# Patient Record
Sex: Female | Born: 1945 | Race: White | Hispanic: No | Marital: Married
Health system: Southern US, Community
[De-identification: ages and names within clinical notes are randomized; demographics above are authoritative.]

---

## 2000-01-28 ENCOUNTER — Ambulatory Visit (HOSPITAL_COMMUNITY): Admission: RE | Admit: 2000-01-28 | Discharge: 2000-01-28 | Payer: Self-pay | Admitting: Gastroenterology

## 2000-11-28 ENCOUNTER — Other Ambulatory Visit: Admission: RE | Admit: 2000-11-28 | Discharge: 2000-11-28 | Payer: Self-pay | Admitting: Gynecology

## 2002-05-11 ENCOUNTER — Other Ambulatory Visit: Admission: RE | Admit: 2002-05-11 | Discharge: 2002-05-11 | Payer: Self-pay | Admitting: Gynecology

## 2002-05-13 ENCOUNTER — Encounter: Admission: RE | Admit: 2002-05-13 | Discharge: 2002-05-13 | Payer: Self-pay | Admitting: Gynecology

## 2002-05-13 ENCOUNTER — Encounter: Payer: Self-pay | Admitting: Gynecology

## 2003-12-22 ENCOUNTER — Other Ambulatory Visit: Admission: RE | Admit: 2003-12-22 | Discharge: 2003-12-22 | Payer: Self-pay | Admitting: Gynecology

## 2005-04-09 ENCOUNTER — Encounter: Admission: RE | Admit: 2005-04-09 | Discharge: 2005-04-09 | Payer: Self-pay | Admitting: Internal Medicine

## 2005-04-25 ENCOUNTER — Encounter: Admission: RE | Admit: 2005-04-25 | Discharge: 2005-04-25 | Payer: Self-pay | Admitting: Internal Medicine

## 2005-11-07 ENCOUNTER — Encounter: Admission: RE | Admit: 2005-11-07 | Discharge: 2005-11-07 | Payer: Self-pay | Admitting: Internal Medicine

## 2006-05-13 ENCOUNTER — Encounter: Admission: RE | Admit: 2006-05-13 | Discharge: 2006-05-13 | Payer: Self-pay | Admitting: Internal Medicine

## 2007-04-02 ENCOUNTER — Other Ambulatory Visit: Admission: RE | Admit: 2007-04-02 | Discharge: 2007-04-02 | Payer: Self-pay | Admitting: *Deleted

## 2007-04-16 ENCOUNTER — Encounter: Admission: RE | Admit: 2007-04-16 | Discharge: 2007-04-16 | Payer: Self-pay | Admitting: *Deleted

## 2008-12-27 IMAGING — CT CT CHEST W/O CM
2 of 4 series · 15 of 36 positions shown, 18 images · IV contrast (agent unspecified)
Comparison: 05/13/06.

CLINICAL DATA: Follow-up lung nodules. 
 CHEST CT WITHOUT CONTRAST:
TECHNIQUE: Multidetector CT imaging of the chest was performed following the standard protocol without IV contrast.

[Series 3: routine chest · axial · 0.70mm/px · z∈[-258,+7]mm · 12 of 63 slices shown, 15 images]
[im 5/63  mediastinal]
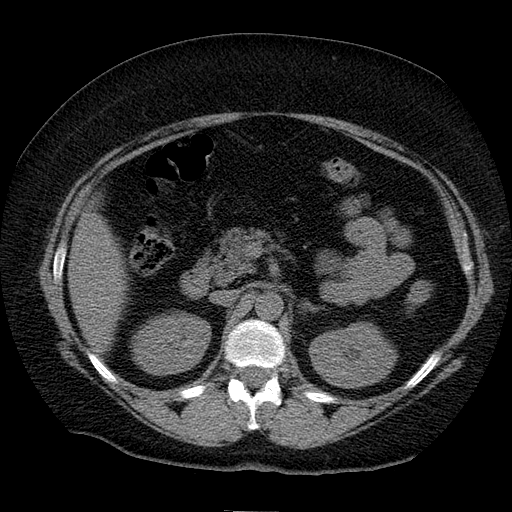
[im 5/63  lung]
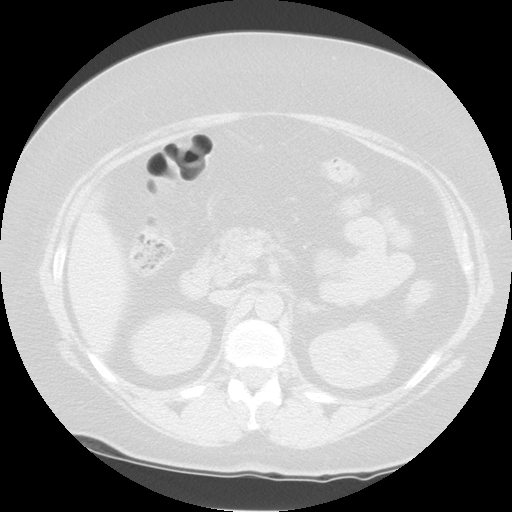
[im 10/63  lung]
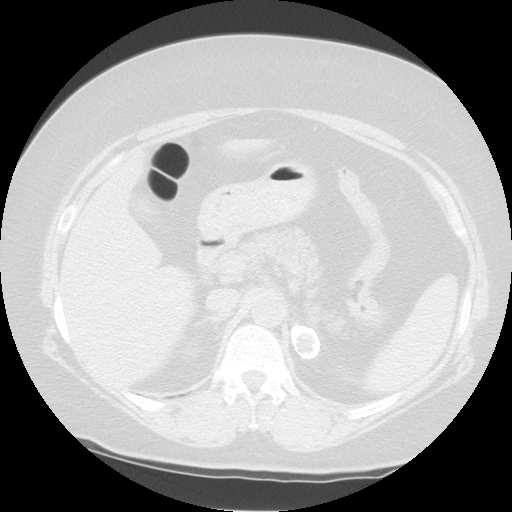
[im 15/63  lung]
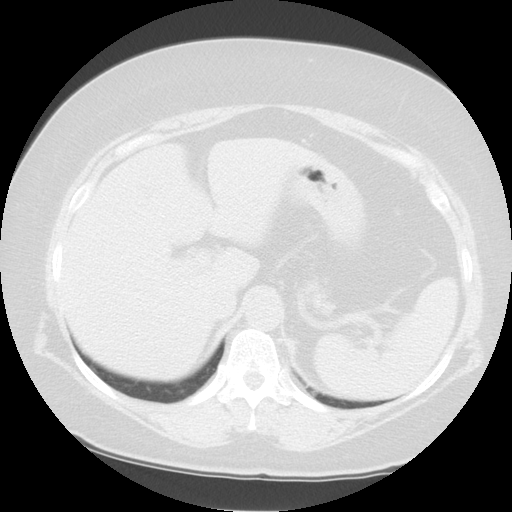
[im 20/63  lung]
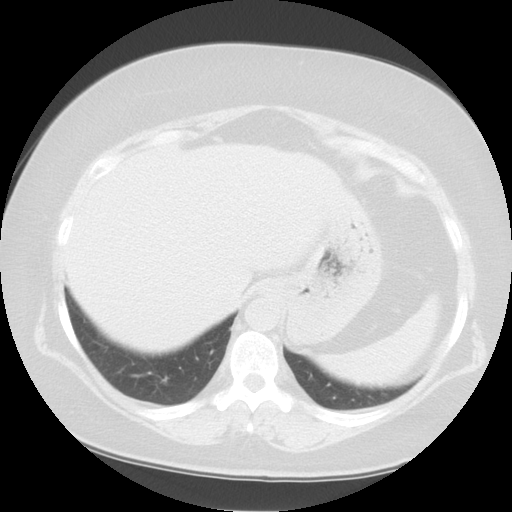
[im 24/63  mediastinal]
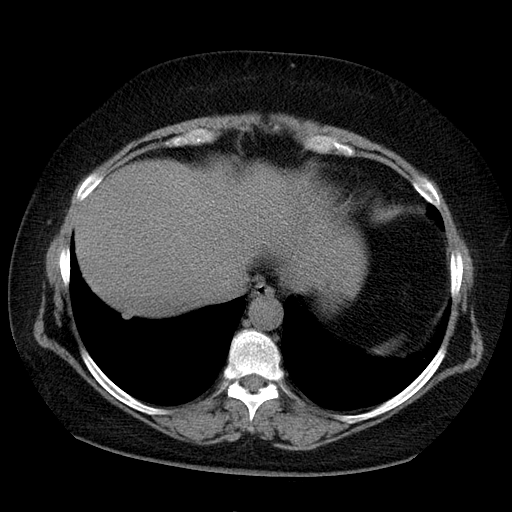
[im 24/63  lung]
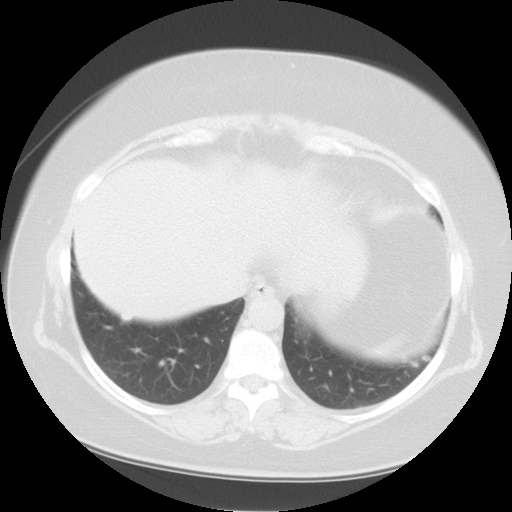
[im 29/63  lung]
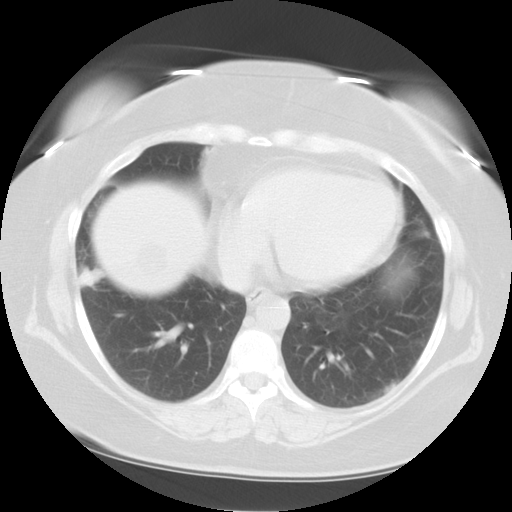
[im 34/63  lung]
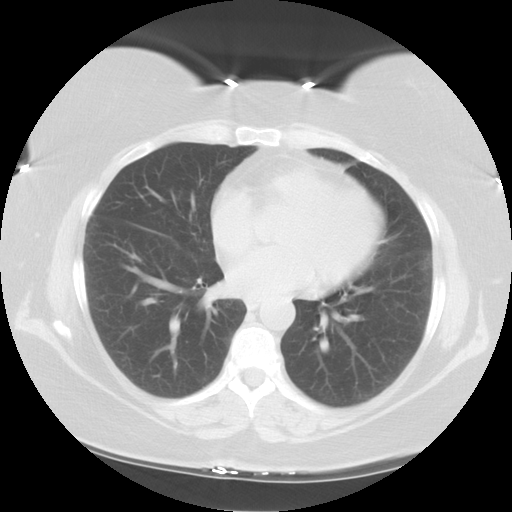
[im 39/63  lung]
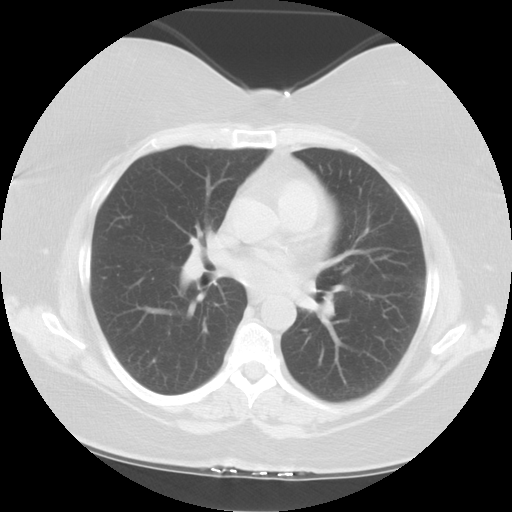
[im 43/63  mediastinal]
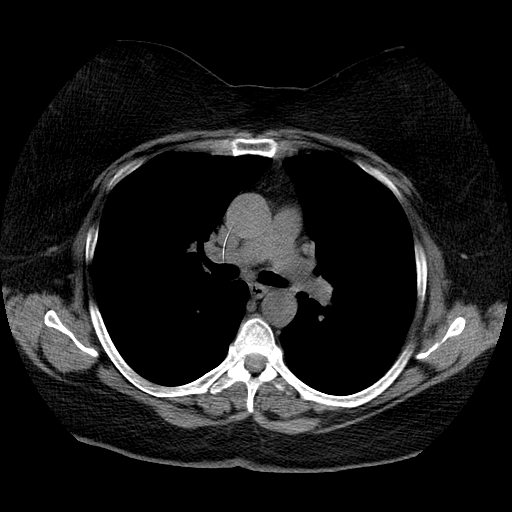
[im 43/63  lung]
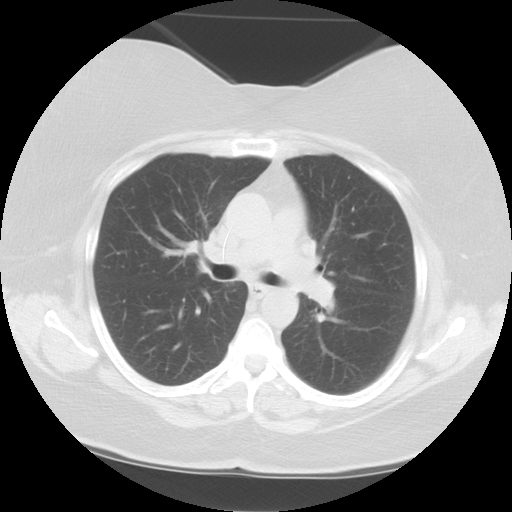
[im 48/63  lung]
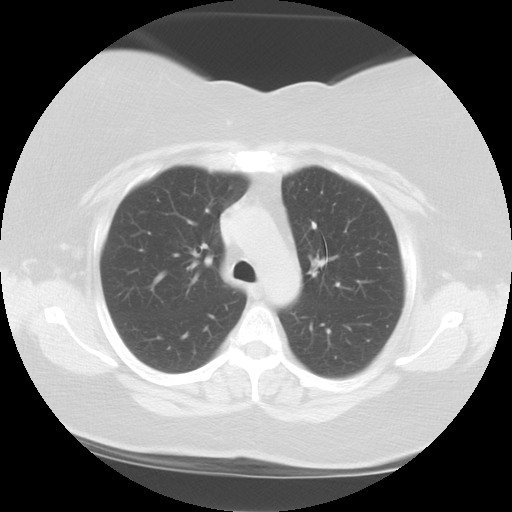
[im 53/63  lung]
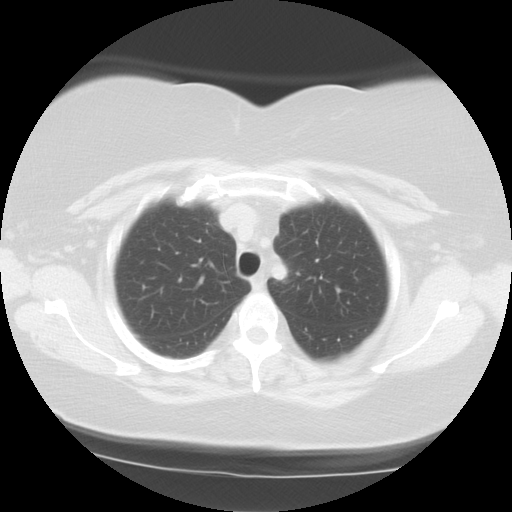
[im 58/63  lung]
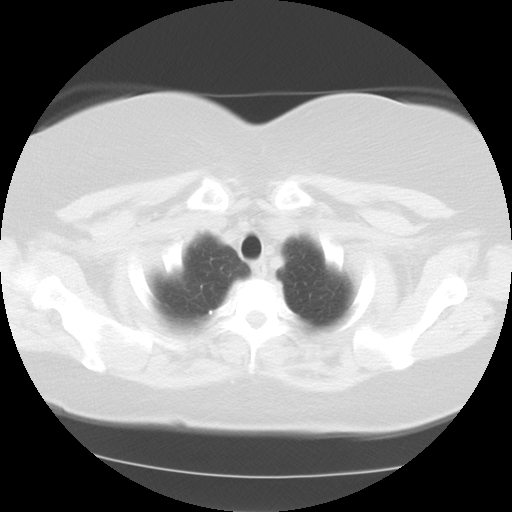

[Series 602: sagittal body · sagittal · 0.70mm/px · 3 of 145 slices shown]
[im 29/145  lung]
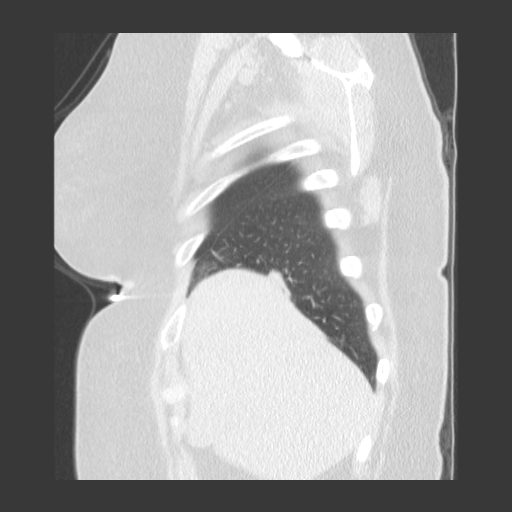
[im 58/145  lung]
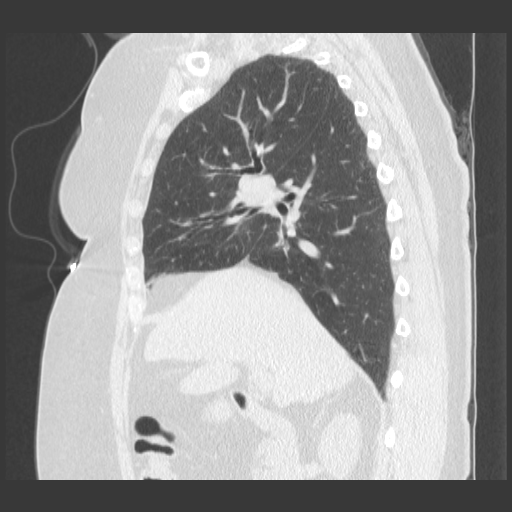
[im 87/145  lung]
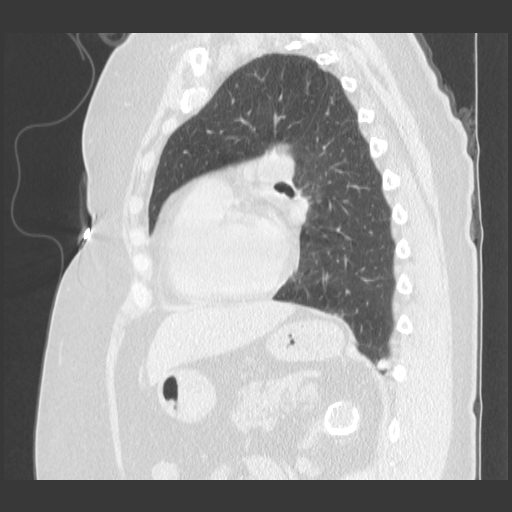

[15 of 36 positions shown; findings below may reference images not displayed]

The previously noted lung nodules, the majority of which involve both lower lobes, appear stable and again, a few do appear to contain some calcification. These findings are most consistent with prior granulomatous disease.   No new lung nodule is seen and no enlargement of any nodules is noted.  No mediastinal or hilar adenopathy is seen. The pulmonary arteries and thoracic aorta appear normal on this unenhanced study.  A cyst in the dome of the right lobe of the liver is unchanged, as is the calcification of the left adrenal gland.
IMPRESSION: Stable lung nodules most consistent with benign process.  These nodules also are stable compared to a CT of [DATE].  Since these nodules have remained stable over two years, they are considered benign.   No further follow-up is necessary.  No  new lung nodule is seen.

## 2009-05-03 ENCOUNTER — Other Ambulatory Visit: Admission: RE | Admit: 2009-05-03 | Discharge: 2009-05-03 | Payer: Self-pay | Admitting: Family Medicine

## 2010-12-29 ENCOUNTER — Encounter: Payer: Self-pay | Admitting: Internal Medicine

## 2010-12-30 ENCOUNTER — Encounter: Payer: Self-pay | Admitting: *Deleted

## 2010-12-30 ENCOUNTER — Encounter: Payer: Self-pay | Admitting: Internal Medicine

## 2011-01-23 ENCOUNTER — Ambulatory Visit (HOSPITAL_COMMUNITY)
Admission: RE | Admit: 2011-01-23 | Discharge: 2011-01-23 | Disposition: A | Discharge status: Home / Self Care | Payer: Federal, State, Local not specified - PPO | Source: Ambulatory Visit | Attending: Internal Medicine | Admitting: Internal Medicine

## 2011-01-23 DIAGNOSIS — R05 Cough: Secondary | ICD-10-CM | POA: Insufficient documentation

## 2011-01-23 DIAGNOSIS — R059 Cough, unspecified: Secondary | ICD-10-CM | POA: Insufficient documentation

## 2011-01-23 DIAGNOSIS — Z79899 Other long term (current) drug therapy: Secondary | ICD-10-CM | POA: Insufficient documentation

## 2011-01-23 DIAGNOSIS — R0989 Other specified symptoms and signs involving the circulatory and respiratory systems: Secondary | ICD-10-CM | POA: Insufficient documentation

## 2011-01-23 DIAGNOSIS — R0609 Other forms of dyspnea: Secondary | ICD-10-CM | POA: Insufficient documentation

## 2011-01-23 DIAGNOSIS — R062 Wheezing: Secondary | ICD-10-CM | POA: Insufficient documentation

## 2011-05-13 ENCOUNTER — Other Ambulatory Visit (HOSPITAL_COMMUNITY)
Admission: RE | Admit: 2011-05-13 | Discharge: 2011-05-13 | Disposition: A | Discharge status: Home / Self Care | Payer: Federal, State, Local not specified - PPO | Source: Ambulatory Visit | Attending: Internal Medicine | Admitting: Internal Medicine

## 2011-05-14 ENCOUNTER — Other Ambulatory Visit: Payer: Self-pay | Admitting: Family Medicine

## 2011-05-14 ENCOUNTER — Other Ambulatory Visit: Payer: Self-pay

## 2011-05-14 DIAGNOSIS — J069 Acute upper respiratory infection, unspecified: Secondary | ICD-10-CM

## 2011-05-14 DIAGNOSIS — Z01419 Encounter for gynecological examination (general) (routine) without abnormal findings: Secondary | ICD-10-CM | POA: Insufficient documentation

## 2011-05-16 ENCOUNTER — Ambulatory Visit
Admission: RE | Admit: 2011-05-16 | Discharge: 2011-05-16 | Disposition: A | Discharge status: Home / Self Care | Payer: Federal, State, Local not specified - PPO | Source: Ambulatory Visit | Attending: Family Medicine | Admitting: Family Medicine

## 2011-05-16 DIAGNOSIS — J069 Acute upper respiratory infection, unspecified: Secondary | ICD-10-CM

## 2011-06-25 ENCOUNTER — Other Ambulatory Visit: Payer: Self-pay | Admitting: Family Medicine

## 2011-06-25 DIAGNOSIS — Z1231 Encounter for screening mammogram for malignant neoplasm of breast: Secondary | ICD-10-CM

## 2011-07-02 ENCOUNTER — Ambulatory Visit
Admission: RE | Admit: 2011-07-02 | Discharge: 2011-07-02 | Disposition: A | Discharge status: Home / Self Care | Payer: Federal, State, Local not specified - PPO | Source: Ambulatory Visit | Attending: Family Medicine | Admitting: Family Medicine

## 2011-07-02 DIAGNOSIS — Z1231 Encounter for screening mammogram for malignant neoplasm of breast: Secondary | ICD-10-CM

## 2012-09-18 ENCOUNTER — Other Ambulatory Visit: Payer: Self-pay | Admitting: Family Medicine

## 2012-09-18 DIAGNOSIS — Z1231 Encounter for screening mammogram for malignant neoplasm of breast: Secondary | ICD-10-CM

## 2012-10-12 ENCOUNTER — Ambulatory Visit
Admission: RE | Admit: 2012-10-12 | Discharge: 2012-10-12 | Disposition: A | Discharge status: Home / Self Care | Payer: Federal, State, Local not specified - PPO | Source: Ambulatory Visit | Attending: Family Medicine | Admitting: Family Medicine

## 2012-10-12 ENCOUNTER — Other Ambulatory Visit: Payer: Self-pay | Admitting: Family Medicine

## 2012-10-12 ENCOUNTER — Other Ambulatory Visit (HOSPITAL_COMMUNITY)
Admission: RE | Admit: 2012-10-12 | Discharge: 2012-10-12 | Disposition: A | Discharge status: Home / Self Care | Payer: Federal, State, Local not specified - PPO | Source: Ambulatory Visit | Attending: Family Medicine | Admitting: Family Medicine

## 2012-10-12 DIAGNOSIS — Z1231 Encounter for screening mammogram for malignant neoplasm of breast: Secondary | ICD-10-CM

## 2012-10-12 DIAGNOSIS — Z0000 Encounter for general adult medical examination without abnormal findings: Secondary | ICD-10-CM | POA: Insufficient documentation

## 2012-10-12 DIAGNOSIS — Z780 Asymptomatic menopausal state: Secondary | ICD-10-CM

## 2012-10-14 ENCOUNTER — Ambulatory Visit
Admission: RE | Admit: 2012-10-14 | Discharge: 2012-10-14 | Disposition: A | Discharge status: Home / Self Care | Payer: Federal, State, Local not specified - PPO | Source: Ambulatory Visit | Attending: Family Medicine | Admitting: Family Medicine

## 2012-10-14 DIAGNOSIS — Z780 Asymptomatic menopausal state: Secondary | ICD-10-CM

## 2013-01-26 IMAGING — US US PELVIS COMPLETE
1 series · 14 of 25 positions shown · non-contrast
Comparison: None.

CLINICAL DATA: Family history of ovarian carcinoma in the patient's
sister. Post menopausal.  No HRT.

TRANSABDOMINAL AND TRANSVAGINAL ULTRASOUND OF PELVIS
TECHNIQUE: Both transabdominal and transvaginal ultrasound
examinations of the pelvis were performed. Transabdominal technique
was performed for global imaging of the pelvis including uterus,
ovaries, adnexal regions, and pelvic cul-de-sac.
It was necessary to proceed with endovaginal exam following the
transabdominal exam to visualize the details of the parenchyma of
the endometrium and ovaries.

[Series 1: us pelvis complete · 0.21mm/px · 14 of 39 slices shown]
[im 1/39]
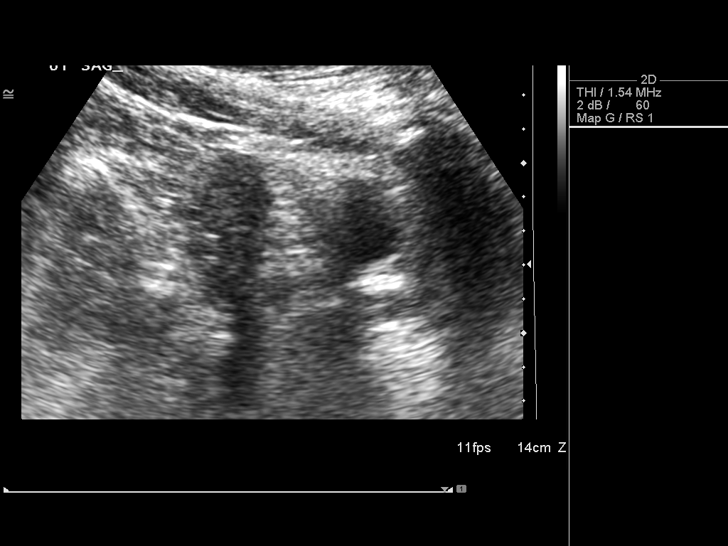
[im 4/39]
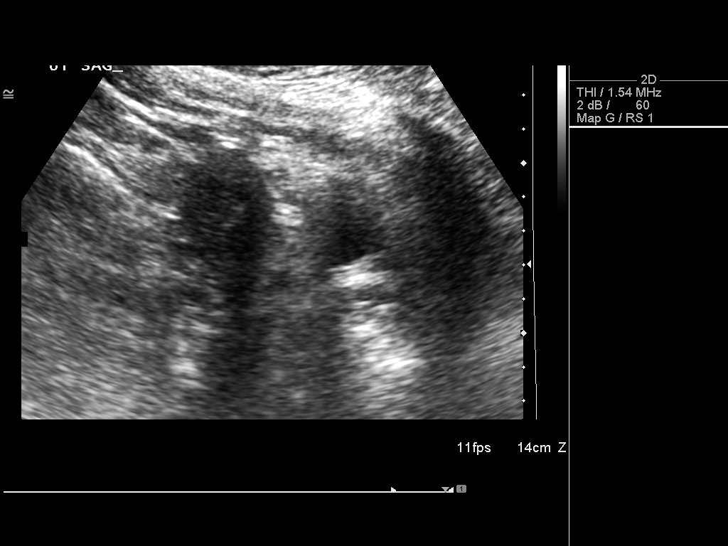
[im 7/39]
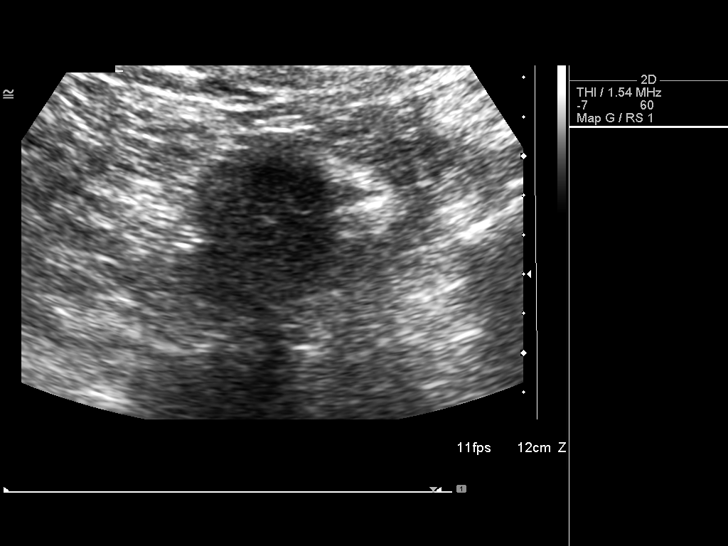
[im 10/39]
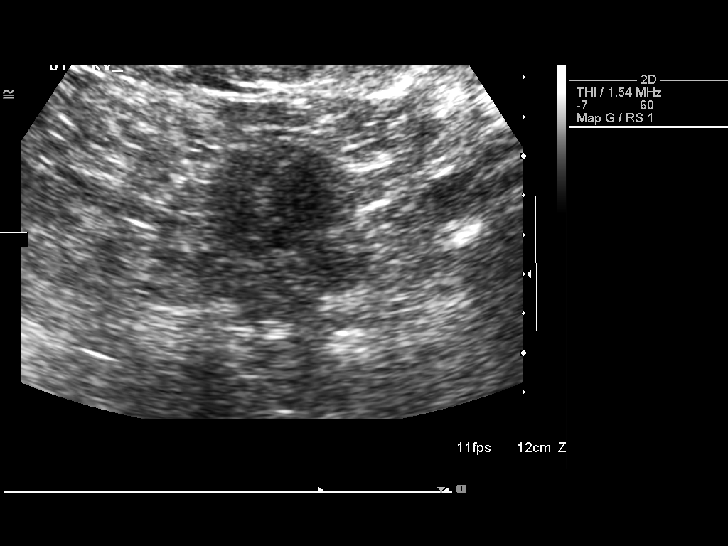
[im 13/39]
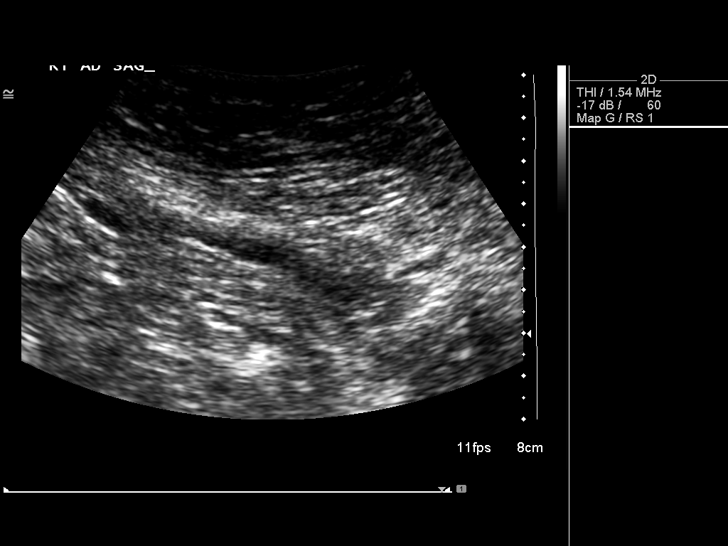
[im 15/39]
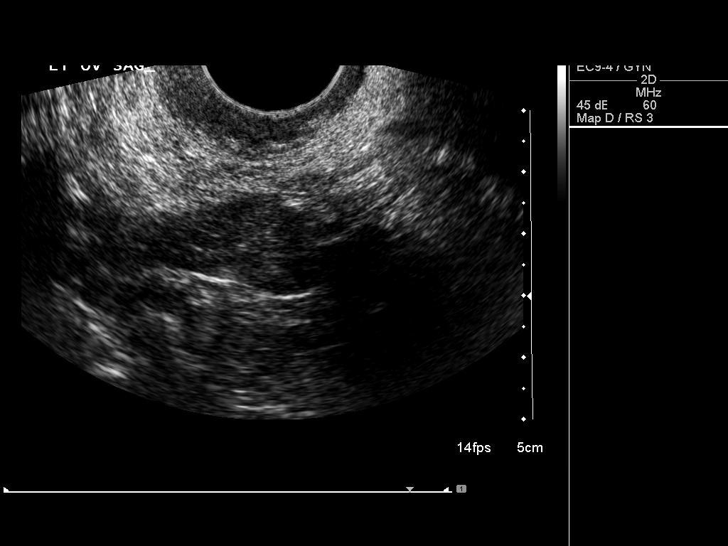
[im 18/39]
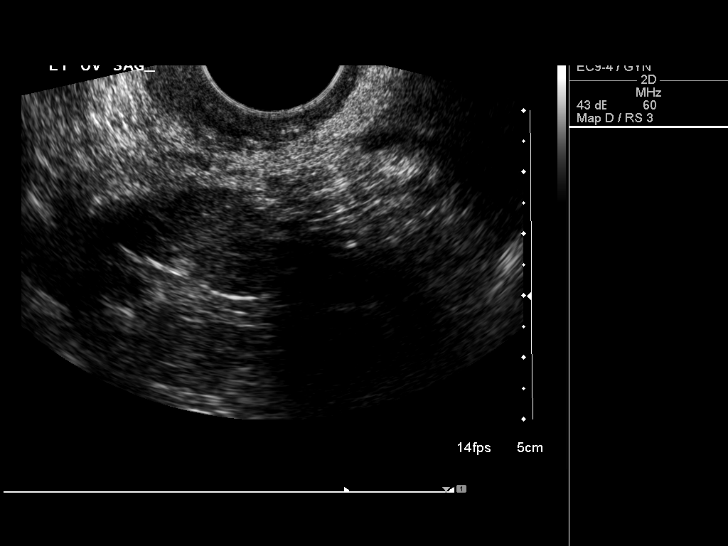
[im 21/39]
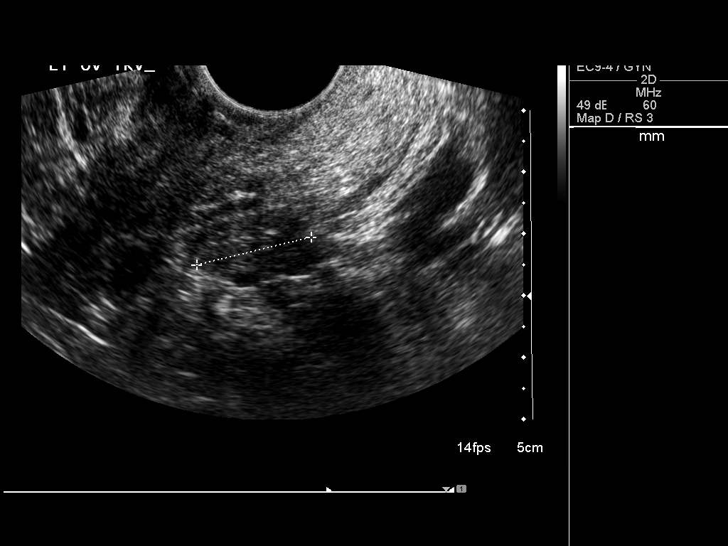
[im 24/39]
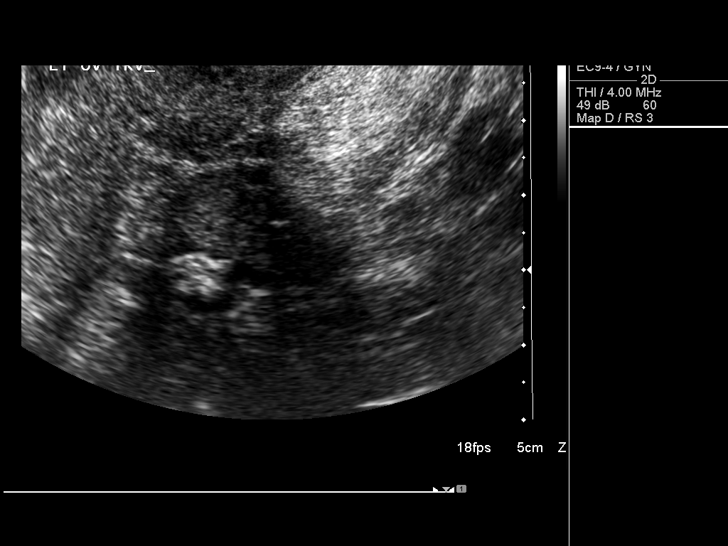
[im 26/39]
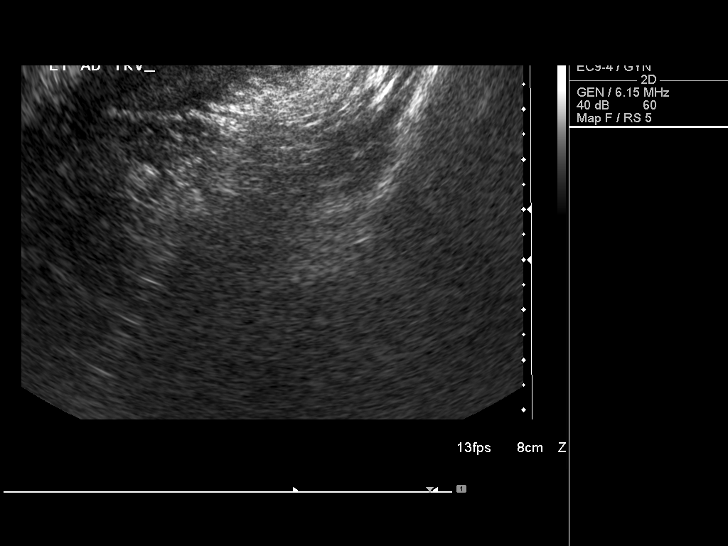
[im 29/39]
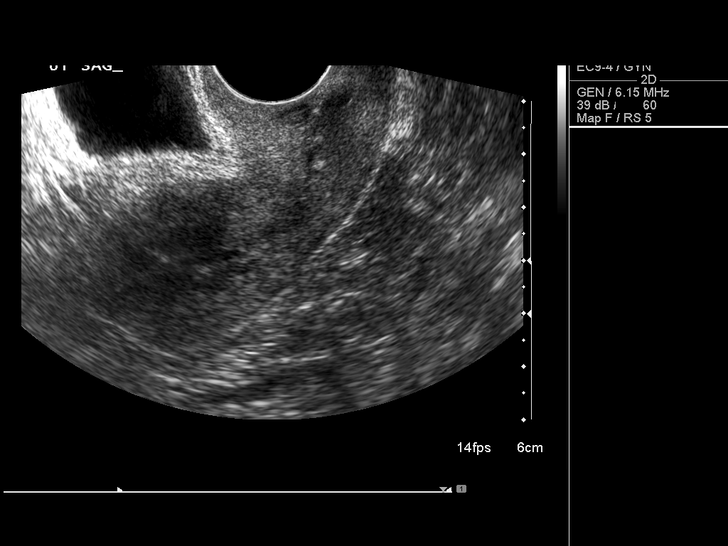
[im 32/39]
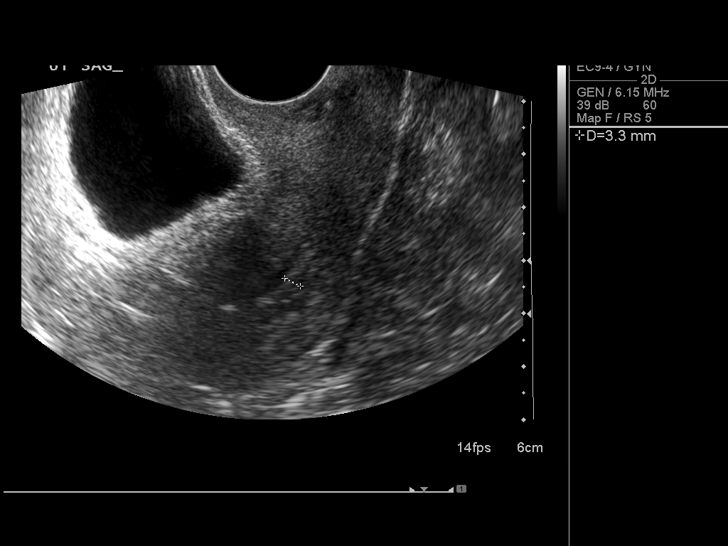
[im 35/39]
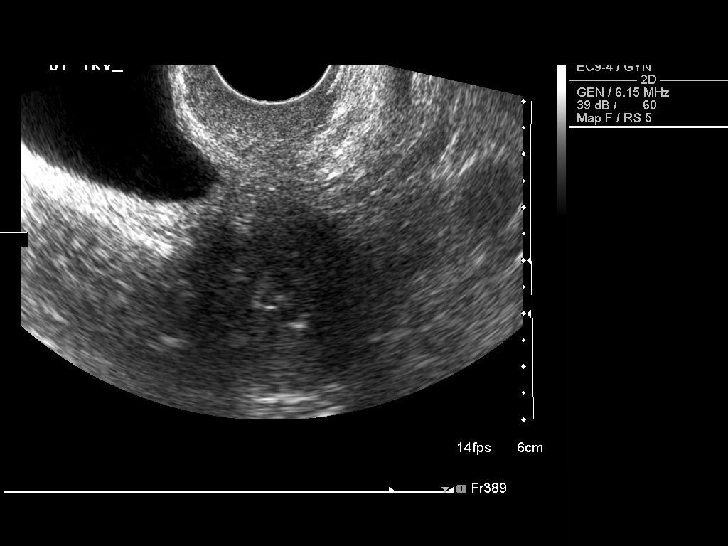
[im 39/39]
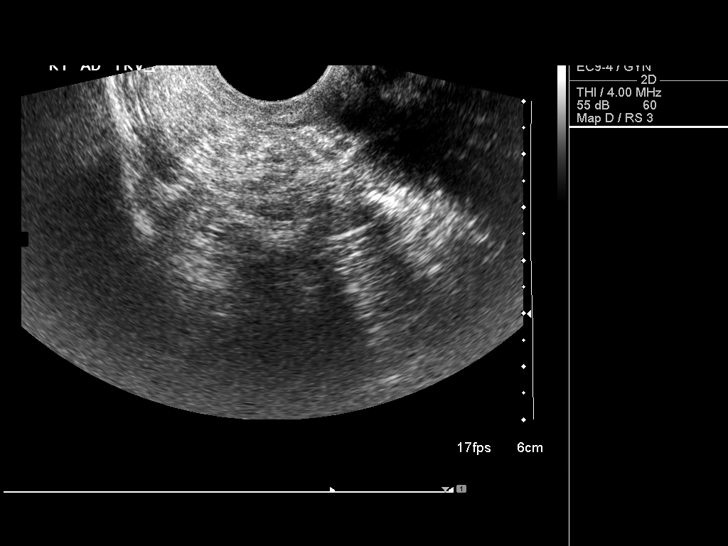

[14 of 25 positions shown; findings below may reference images not displayed]

FINDINGS: Uterus:  Uterus measures 6.9 x 3.0 x 3.5 cm.  No fibroids or other
uterine masses identified.

Endometrium:  Endometrial stripe thickness is 3.3 mm.  No
endometrial fluid collection or mass is seen.

Right ovary:  The right ovary was not visualized.  No adnexal mass
was evident.

Left ovary:  Left ovary measures 2.6 x 1.6 x 1.9 cm.  Normal
appearance/no adnexal mass.

Other findings:  No free fluid.
IMPRESSION: No myometrial or endometrial or left ovarian lesion was identified.

The right ovary cannot be visualized.

No free fluid is seen in the pelvis.

## 2014-03-25 ENCOUNTER — Other Ambulatory Visit: Payer: Self-pay | Admitting: Family

## 2014-03-25 ENCOUNTER — Ambulatory Visit
Admission: RE | Admit: 2014-03-25 | Discharge: 2014-03-25 | Disposition: A | Discharge status: Home / Self Care | Payer: Federal, State, Local not specified - PPO | Source: Ambulatory Visit | Attending: Family | Admitting: Family

## 2014-03-25 DIAGNOSIS — R509 Fever, unspecified: Secondary | ICD-10-CM

## 2014-03-25 DIAGNOSIS — J988 Other specified respiratory disorders: Secondary | ICD-10-CM

## 2016-04-09 ENCOUNTER — Other Ambulatory Visit: Payer: Self-pay

## 2016-04-09 DIAGNOSIS — Z1231 Encounter for screening mammogram for malignant neoplasm of breast: Secondary | ICD-10-CM

## 2016-04-23 ENCOUNTER — Ambulatory Visit
Admission: RE | Admit: 2016-04-23 | Discharge: 2016-04-23 | Disposition: A | Discharge status: Home / Self Care | Payer: Federal, State, Local not specified - PPO | Source: Ambulatory Visit

## 2016-04-23 DIAGNOSIS — Z1231 Encounter for screening mammogram for malignant neoplasm of breast: Secondary | ICD-10-CM
